# Patient Record
Sex: Male | Born: 1985 | Hispanic: No | Marital: Married | Smoking: Current some day smoker
Health system: Southern US, Community
[De-identification: ages and names within clinical notes are randomized; demographics above are authoritative.]

---

## 2013-03-27 ENCOUNTER — Encounter: Payer: Self-pay | Admitting: Internal Medicine

## 2013-03-27 ENCOUNTER — Ambulatory Visit: Payer: Medicaid Other | Attending: Internal Medicine | Admitting: Internal Medicine

## 2013-03-27 VITALS — BP 120/76 | HR 86 | Temp 99.0°F | Resp 16 | Ht 65.5 in | Wt 167.0 lb

## 2013-03-27 DIAGNOSIS — K649 Unspecified hemorrhoids: Secondary | ICD-10-CM

## 2013-03-27 LAB — LIPID PANEL
Cholesterol: 148 mg/dL (ref 0–200)
Total CHOL/HDL Ratio: 4.1 Ratio
Triglycerides: 189 mg/dL — ABNORMAL HIGH (ref <150)
VLDL: 38 mg/dL (ref 0–40)

## 2013-03-27 LAB — TSH: TSH: 1.199 u[IU]/mL (ref 0.350–4.500)

## 2013-03-27 LAB — CBC WITH DIFFERENTIAL/PLATELET
Basophils Absolute: 0 10*3/uL (ref 0.0–0.1)
Basophils Relative: 0 % (ref 0–1)
Eosinophils Absolute: 0.5 10*3/uL (ref 0.0–0.7)
Eosinophils Relative: 8 % — ABNORMAL HIGH (ref 0–5)
HCT: 42.7 % (ref 39.0–52.0)
Hemoglobin: 15 g/dL (ref 13.0–17.0)
Lymphs Abs: 1.8 10*3/uL (ref 0.7–4.0)
MCH: 30.2 pg (ref 26.0–34.0)
MCHC: 35.1 g/dL (ref 30.0–36.0)
MCV: 85.9 fL (ref 78.0–100.0)
Monocytes Absolute: 0.6 10*3/uL (ref 0.1–1.0)
Monocytes Relative: 9 % (ref 3–12)
Neutrophils Relative %: 57 % (ref 43–77)
RBC: 4.97 MIL/uL (ref 4.22–5.81)
RDW: 13.4 % (ref 11.5–15.5)

## 2013-03-27 LAB — COMPLETE METABOLIC PANEL WITH GFR
AST: 17 U/L (ref 0–37)
Albumin: 4.5 g/dL (ref 3.5–5.2)
Alkaline Phosphatase: 63 U/L (ref 39–117)
BUN: 7 mg/dL (ref 6–23)
Calcium: 9.3 mg/dL (ref 8.4–10.5)
Chloride: 102 mEq/L (ref 96–112)
Creat: 0.85 mg/dL (ref 0.50–1.35)
GFR, Est African American: >89 mL/min
GFR, Est Non African American: >89 mL/min
Glucose, Bld: 102 mg/dL — ABNORMAL HIGH (ref 70–99)

## 2013-03-27 MED ORDER — LORATADINE 10 MG PO TABS: 10.0000 mg | ORAL_TABLET | Freq: Every day | ORAL | Status: DC

## 2013-03-27 MED ORDER — HYDROCORTISONE ACETATE 25 MG RE SUPP: 25.0000 mg | Freq: Two times a day (BID) | RECTAL | Status: DC

## 2013-03-27 MED ORDER — POLYETHYLENE GLYCOL 3350 17 GM/SCOOP PO POWD: 17.0000 g | Freq: Two times a day (BID) | ORAL | Status: DC | PRN

## 2013-03-27 MED ORDER — HYDROCORTISONE 2.5 % RE CREA: 1.0000 "application " | TOPICAL_CREAM | Freq: Two times a day (BID) | RECTAL | Status: DC

## 2013-03-27 NOTE — Addendum Note (Signed)
Addended by: Allayne Stack R on: 03/27/2013 12:46 PM   Modules accepted: Orders, Medications

## 2013-03-27 NOTE — Progress Notes (Signed)
Pt is here to establish care. His case worker made this appointment for him today. Pt is requesting a physical.

## 2013-03-27 NOTE — Progress Notes (Signed)
Patient ID: Jared Woods, male   DOB: April 08, 1985, 27 y.o.   MRN: [id number]   CC:  HPI: 27 year old male from [place] to establish care. History is obtained using an interpreter. His chief complaint is history of being constipated with hemorrhoids, every 6 months he notices blood in the stool. No current active bleeding He also notices nosebleeds, related to sinus infection.  He is a nonsmoker nonalcoholic  Family history positive for hypertension in the mother     No Known Allergies History reviewed. No pertinent past medical history. No current outpatient prescriptions on file prior to visit.   No current facility-administered medications on file prior to visit.   History reviewed. No pertinent family history. History   Social History  . Marital Status: Married    Spouse Name: N/A    Number of Children: N/A  . Years of Education: N/A   Occupational History  . Not on file.   Social History Main Topics  . Smoking status: Current Some Day Smoker -- 0.10 packs/day    Types: Cigarettes  . Smokeless tobacco: Not on file  . Alcohol Use: 0.0 oz/week    .5 drink(s) per week  . Drug Use: No  . Sexual Activity: Yes   Other Topics Concern  . Not on file   Social History Narrative  . No narrative on file    Review of Systems  Constitutional: Negative for fever, chills, diaphoresis, activity change, appetite change and fatigue.  HENT: Negative for ear pain, nosebleeds, congestion, facial swelling, rhinorrhea, neck pain, neck stiffness and ear discharge.   Eyes: Negative for pain, discharge, redness, itching and visual disturbance.  Respiratory: Negative for cough, choking, chest tightness, shortness of breath, wheezing and stridor.   Cardiovascular: Negative for chest pain, palpitations and leg swelling.  Gastrointestinal: Negative for abdominal distention.  Genitourinary: Negative for dysuria, urgency, frequency, hematuria, flank pain, decreased urine volume, difficulty  urinating and dyspareunia.  Musculoskeletal: Negative for back pain, joint swelling, arthralgias and gait problem.  Neurological: Negative for dizziness, tremors, seizures, syncope, facial asymmetry, speech difficulty, weakness, light-headedness, numbness and headaches.  Hematological: Negative for adenopathy. Does not bruise/bleed easily.  Psychiatric/Behavioral: Negative for hallucinations, behavioral problems, confusion, dysphoric mood, decreased concentration and agitation.    Objective:   Filed Vitals:   03/27/13 1100  BP: 120/76  Pulse: 86  Temp: 99 F (37.2 C)  Resp: 16    Physical Exam  Constitutional: Appears well-developed and well-nourished. No distress.  HENT: Normocephalic. External right and left ear normal. Oropharynx is clear and moist.  Eyes: Conjunctivae and EOM are normal. PERRLA, no scleral icterus.  Neck: Normal ROM. Neck supple. No JVD. No tracheal deviation. No thyromegaly.  CVS: RRR, S1/S2 +, no murmurs, no gallops, no carotid bruit.  Pulmonary: Effort and breath sounds normal, no stridor, rhonchi, wheezes, rales.  Abdominal: Soft. BS +,  no distension, tenderness, rebound or guarding.  Musculoskeletal: Normal range of motion. No edema and no tenderness.  Lymphadenopathy: No lymphadenopathy noted, cervical, inguinal. Neuro: Alert. Normal reflexes, muscle tone coordination. No cranial nerve deficit. Skin: Skin is warm and dry. No rash noted. Not diaphoretic. No erythema. No pallor.  Psychiatric: Normal mood and affect. Behavior, judgment, thought content normal.   No results found for this basename: WBC, HGB, HCT, MCV, PLT   No results found for this basename: CREATININE, BUN, NA, K, CL, CO2    No results found for this basename: HGBA1C   Lipid Panel  No results found for this basename: chol,  trig, hdl, cholhdl, vldl, ldlcalc       Assessment and plan:   There are no active problems to display for this patient.      Hemorrhoids Prescribed  Anusol HC for hemorrhoids Prescribed MiraLAX for constipation If symptoms persist would need a GI referral   Sinusitis Started the patient on Claritin  Establish care Obtain baseline labs Flu vaccination  The patient was given clear instructions to go to ER or return to medical center if symptoms don't improve, worsen or new problems develop. The patient verbalized understanding. The patient was told to call to get any lab results if not heard anything in the next week.

## 2013-04-08 ENCOUNTER — Telehealth: Payer: Self-pay | Admitting: *Deleted

## 2013-04-08 NOTE — Telephone Encounter (Signed)
Message copied by Kacy Conely, [place] R on Mon Apr 08, 2013 10:58 AM ------      Message from: Susie CassetteABROL MD, Germain OsgoodNAYANA      Created: Mon Apr 01, 2013  3:07 PM       Please notify patient of the patient's labs are within normal limits with the exception of triglycerides. Recommend a low-fat low-cholesterol diet. ------

## 2013-04-13 ENCOUNTER — Emergency Department (HOSPITAL_COMMUNITY): Payer: Medicaid Other

## 2013-04-13 ENCOUNTER — Emergency Department (HOSPITAL_COMMUNITY)
Admission: EM | Admit: 2013-04-13 | Discharge: 2013-04-14 | Disposition: A | Discharge status: Home / Self Care | Payer: Medicaid Other | Attending: Emergency Medicine | Admitting: Emergency Medicine

## 2013-04-13 ENCOUNTER — Encounter (HOSPITAL_COMMUNITY): Payer: Self-pay | Admitting: Emergency Medicine

## 2013-04-13 DIAGNOSIS — R69 Illness, unspecified: Secondary | ICD-10-CM

## 2013-04-13 DIAGNOSIS — R51 Headache: Secondary | ICD-10-CM | POA: Insufficient documentation

## 2013-04-13 DIAGNOSIS — Z79899 Other long term (current) drug therapy: Secondary | ICD-10-CM | POA: Insufficient documentation

## 2013-04-13 DIAGNOSIS — R5383 Other fatigue: Secondary | ICD-10-CM

## 2013-04-13 DIAGNOSIS — J111 Influenza due to unidentified influenza virus with other respiratory manifestations: Secondary | ICD-10-CM | POA: Insufficient documentation

## 2013-04-13 DIAGNOSIS — IMO0001 Reserved for inherently not codable concepts without codable children: Secondary | ICD-10-CM | POA: Insufficient documentation

## 2013-04-13 DIAGNOSIS — R112 Nausea with vomiting, unspecified: Secondary | ICD-10-CM | POA: Insufficient documentation

## 2013-04-13 DIAGNOSIS — R5381 Other malaise: Secondary | ICD-10-CM | POA: Insufficient documentation

## 2013-04-13 DIAGNOSIS — F17200 Nicotine dependence, unspecified, uncomplicated: Secondary | ICD-10-CM | POA: Insufficient documentation

## 2013-04-13 LAB — CBC WITH DIFFERENTIAL/PLATELET
BASOS ABS: 0 10*3/uL (ref 0.0–0.1)
Basophils Relative: 0 % (ref 0–1)
EOS ABS: 0 10*3/uL (ref 0.0–0.7)
EOS PCT: 1 % (ref 0–5)
HCT: 38.5 % — ABNORMAL LOW (ref 39.0–52.0)
Hemoglobin: 13.7 g/dL (ref 13.0–17.0)
Lymphocytes Relative: 24 % (ref 12–46)
Lymphs Abs: 0.8 10*3/uL (ref 0.7–4.0)
MCH: 30.2 pg (ref 26.0–34.0)
MCHC: 35.6 g/dL (ref 30.0–36.0)
MCV: 85 fL (ref 78.0–100.0)
Monocytes Absolute: 0.3 10*3/uL (ref 0.1–1.0)
Monocytes Relative: 8 % (ref 3–12)
Neutro Abs: 2.2 10*3/uL (ref 1.7–7.7)
Neutrophils Relative %: 67 % (ref 43–77)
PLATELETS: 138 10*3/uL — AB (ref 150–400)
RBC: 4.53 MIL/uL (ref 4.22–5.81)
RDW: 12 % (ref 11.5–15.5)
WBC: 3.3 10*3/uL — AB (ref 4.0–10.5)

## 2013-04-13 LAB — BASIC METABOLIC PANEL
BUN: 5 mg/dL — ABNORMAL LOW (ref 6–23)
CALCIUM: 8.4 mg/dL (ref 8.4–10.5)
CO2: 26 mEq/L (ref 19–32)
Chloride: 101 mEq/L (ref 96–112)
Creatinine, Ser: 0.85 mg/dL (ref 0.50–1.35)
GFR calc Af Amer: >90 mL/min (ref >90)
Glucose, Bld: 171 mg/dL — ABNORMAL HIGH (ref 70–99)
Potassium: 4.3 mEq/L (ref 3.7–5.3)
SODIUM: 136 meq/L — AB (ref 137–147)

## 2013-04-13 LAB — CG4 I-STAT (LACTIC ACID): LACTIC ACID, VENOUS: 1.54 mmol/L (ref 0.5–2.2)

## 2013-04-13 MED ORDER — SODIUM CHLORIDE 0.9 % IV BOLUS (SEPSIS)
1000.0000 mL | Freq: Once | INTRAVENOUS | Status: AC
  Administered 2013-04-13: 1000 mL via INTRAVENOUS

## 2013-04-13 MED ORDER — KETOROLAC TROMETHAMINE 30 MG/ML IJ SOLN
30.0000 mg | Freq: Once | INTRAMUSCULAR | Status: AC
  Administered 2013-04-13: 30 mg via INTRAVENOUS
  Filled 2013-04-13: qty 1

## 2013-04-13 NOTE — ED Provider Notes (Signed)
CSN: [id number]     Arrival date & time 04/13/13  2202 History   First MD Initiated Contact with Patient 04/13/13 2212     Chief Complaint  Patient presents with  . Influenza   (Consider location/radiation/quality/duration/timing/severity/associated sxs/prior Treatment) HPI Comments: Patient from [place], has lived in [place] 3-4 months -- presents with c/o fever, cough, myalgias, HA, N/V x 3 days. Has taken 'fever medicine' he got at [facility]. He has SOB, CP with cough. EMS gave tylenol PTA. No PMH heart or lung problems. The onset of this condition was acute. The course is constant. Aggravating factors: none. Alleviating factors: none.    Patient is a 28 y.o. male presenting with flu symptoms. The history is provided by the patient and a friend. The history is limited by a language barrier. A language interpreter was used ([place], [place]).  Influenza Presenting symptoms: cough, fatigue, fever, headache, nausea, shortness of breath and vomiting   Presenting symptoms: no diarrhea, no myalgias, no rhinorrhea and no sore throat   Associated symptoms: chills and nasal congestion   Associated symptoms: no ear pain and no neck stiffness     History reviewed. No pertinent past medical history. History reviewed. No pertinent past surgical history. No family history on file. History  Substance Use Topics  . Smoking status: Current Some Day Smoker -- 0.10 packs/day    Types: Cigarettes  . Smokeless tobacco: Not on file  . Alcohol Use: 0.0 oz/week    .5 drink(s) per week    Review of Systems  Constitutional: Positive for fever, chills and fatigue.  HENT: Positive for congestion. Negative for ear pain, rhinorrhea, sinus pressure and sore throat.   Eyes: Negative for redness.  Respiratory: Positive for cough and shortness of breath. Negative for wheezing.   Cardiovascular: Positive for chest pain (with cough). Negative for leg swelling.  Gastrointestinal: Positive for nausea and vomiting.  Negative for abdominal pain and diarrhea.  Genitourinary: Negative for dysuria.  Musculoskeletal: Negative for myalgias and neck stiffness.  Skin: Negative for rash.  Neurological: Positive for headaches.  Hematological: Negative for adenopathy.    Allergies  Review of patient's allergies indicates no known allergies.  Home Medications   Current Outpatient Rx  Name  Route  Sig  Dispense  Refill  . hydrocortisone (ANUSOL-HC) 25 MG suppository   Rectal   Place 1 suppository (25 mg total) rectally 2 (two) times daily.   30 suppository   5   . loratadine (CLARITIN) 10 MG tablet   Oral   Take 1 tablet (10 mg total) by mouth daily.   30 tablet   11   . polyethylene glycol powder (GLYCOLAX/MIRALAX) powder   Oral   Take 17 g by mouth 2 (two) times daily as needed.   3350 g   10    BP 92/75  Pulse 99  Temp(Src) 100.4 F (38 C) (Oral)  SpO2 96% Physical Exam  Nursing note and vitals reviewed. Constitutional: He appears well-developed and well-nourished.  HENT:  Head: Normocephalic and atraumatic.  Right Ear: External ear normal.  Left Ear: External ear normal.  Nose: Nose normal.  Mouth/Throat: Oropharynx is clear and moist. No oropharyngeal exudate.  Eyes: Conjunctivae are normal. Pupils are equal, round, and reactive to light. Right eye exhibits no discharge. Left eye exhibits no discharge.  Neck: Normal range of motion. Neck supple.  Cardiovascular: Normal rate, regular rhythm and normal heart sounds.   Pulmonary/Chest: Breath sounds normal. Tachypnea noted. No respiratory distress. He has no wheezes.  He has no rales.  Abdominal: Soft. There is no tenderness. There is no rebound and no guarding.  Neurological: He is alert.  Skin: Skin is warm and dry.  Psychiatric: He has a normal mood and affect.    ED Course  Procedures (including critical care time) Labs Review Labs Reviewed  CBC WITH DIFFERENTIAL - Abnormal; Notable for the following:    WBC 3.3 (*)    HCT  38.5 (*)    Platelets 138 (*)    All other components within normal limits  BASIC METABOLIC PANEL - Abnormal; Notable for the following:    Sodium 136 (*)    Glucose, Bld 171 (*)    BUN 5 (*)    All other components within normal limits  CG4 I-STAT (LACTIC ACID)   Imaging Review Dg Chest 2 View  04/13/2013   CLINICAL DATA:  Shortness of breath, cough, fever.  EXAM: CHEST  2 VIEW  COMPARISON:  None.  FINDINGS: The heart size and mediastinal contours are within normal limits. Both lungs are clear. No pleural effusion or pneumothorax is noted. The visualized skeletal structures are unremarkable.  IMPRESSION: No active cardiopulmonary disease.   Electronically Signed   By: Roque LiasJames  Green M.D.   On: 04/13/2013 23:04    EKG Interpretation   None      10:31 PM Patient seen and examined. Work-up initiated. Medications ordered.   Vital signs reviewed and are as follows: Filed Vitals:   04/13/13 2205  BP: 92/75  Pulse: 99  Temp: 100.4 F (38 C)   12:58 AM Patient updated on results via interpreter phone. He states that he is feeling much better after toradol/fluids/zofran. He appears much better.   Patient discharged to home. Encouraged to rest and drink plenty of fluids.  Patient told to return to ED or see their primary doctor if their symptoms worsen, high fever not controlled with tylenol, persistent vomiting, they feel they are dehydrated, or if they have any other concerns.  Patient verbalized understanding and agreed with plan.    BP 92/56  Pulse 66  Temp(Src) 98 F (36.7 C) (Oral)  Resp 18  SpO2 97%    MDM   1. Influenza-like illness    Patient with symptoms consistent with influenza.  Vitals are stable, low-grade fever, improved.  No signs of dehydration, tolerating PO's.  Lungs are clear.  Supportive therapy indicated with return if symptoms worsen.  Patient counseled. BP noted but lactate nml, no tachycardia, patient appears well, non-toxic.      Renne CriglerJoshua Xiomara Sevillano,  PA-C 04/14/13 [phone]

## 2013-04-13 NOTE — ED Notes (Signed)
Pt c/o body aches, fever, cough, vomiting, headache, abd pain x 3 days. Pt does not speak [place]. Triage interview completed through visitor at bedside

## 2013-04-13 NOTE — ED Notes (Signed)
Pt BIB EMS. Pt has had n/v/d and flu symptoms for the past 2 days. Pt had fever 101.2 per EMS. EMS gave pt Tylenol 1000mg  PTA. Pt has productive cough. Pt with no acute distress.

## 2013-04-14 MED ORDER — BENZONATATE 100 MG PO CAPS: 100.0000 mg | ORAL_CAPSULE | Freq: Three times a day (TID) | ORAL | Status: AC

## 2013-04-14 MED ORDER — ONDANSETRON 4 MG PO TBDP: 4.0000 mg | ORAL_TABLET | Freq: Three times a day (TID) | ORAL | Status: AC | PRN

## 2013-04-14 MED ORDER — NAPROXEN 500 MG PO TABS: 500.0000 mg | ORAL_TABLET | Freq: Two times a day (BID) | ORAL | Status: AC

## 2013-04-14 NOTE — Discharge Instructions (Signed)
Please read and follow all provided instructions.  Your diagnoses today include:  1. Influenza-like illness     Tests performed today include:  Chest x-ray - does not show pneumonia  Blood counts and electrolytes - no concerning infection  Vital signs. See below for your results today.   Medications prescribed:   Naproxen - anti-inflammatory pain medication  Do not exceed 500mg  naproxen every 12 hours, take with food  You have been prescribed an anti-inflammatory medication or NSAID. Take with food. Take smallest effective dose for the shortest duration needed for your pain. Stop taking if you experience stomach pain or vomiting.    Zofran (ondansetron) - for nausea and vomiting   Tessalon Perles - cough suppressant medication  Take any prescribed medications only as directed.  Home care instructions:  Follow any educational materials contained in this packet. Please continue drinking plenty of fluids. Use over-the-counter cold and flu medications as needed as directed on packaging for symptom relief. You may also use ibuprofen or tylenol as directed on packaging for pain or fever.   BE VERY CAREFUL not to take multiple medicines containing Tylenol (also called acetaminophen). Doing so can lead to an overdose which can damage your liver and cause liver failure and possibly death.   Follow-up instructions: Please follow-up with your primary care provider in the next 3 days for further evaluation of your symptoms. If you do not have a primary care doctor -- see below for referral information.   Return instructions:   Please return to the Emergency Department if you experience worsening symptoms.  Please return if you have a high fever greater than 101 degrees not controlled with over-the-counter medications, persistent vomiting and cannot keep down fluids, or worsening trouble breathing.  Please return if you have any other emergent concerns.  Additional  Information:  Your vital signs today were: BP 92/56   Pulse 66   Temp(Src) 98 F (36.7 C) (Oral)   Resp 18   SpO2 97% If your blood pressure (BP) was elevated above 135/85 this visit, please have this repeated by your doctor within one month.    Emergency Department Resource Guide 1) Find a Doctor and Pay Out of Pocket Although you won't have to find out who is covered by your insurance plan, it is a good idea to ask around and get recommendations. You will then need to call the office and see if the doctor you have chosen will accept you as a new patient and what types of options they offer for patients who are self-pay. Some doctors offer discounts or will set up payment plans for their patients who do not have insurance, but you will need to ask so you aren't surprised when you get to your appointment.  2) Contact Your Local Health Department Not all health departments have doctors that can see patients for sick visits, but many do, so it is worth a call to see if yours does. If you don't know where your local health department is, you can check in your phone book. The CDC also has a tool to help you locate your state's health department, and many state websites also have listings of all of their local health departments.  3) Find a Walk-in Clinic If your illness is not likely to be very severe or complicated, you may want to try a walk in clinic. These are popping up all over the country in pharmacies, drugstores, and shopping centers. They're usually staffed by nurse practitioners or physician  assistants that have been trained to treat common illnesses and complaints. They're usually fairly quick and inexpensive. However, if you have serious medical issues or chronic medical problems, these are probably not your best option.  No Primary Care Doctor: - Call Health Connect at  [phone] - they can help you locate a primary care doctor that  accepts your insurance, provides certain services,  etc. - Physician Referral Service- [phone]  Chronic Pain Problems: Organization         Address  Phone   Notes  [facility]  [phone] Patients need to be referred by their primary care doctor.   Medication Assistance: Organization         Address  Phone   Notes  [facility] Medication [facility] [address] [place] [place], [place] [address] [phone] --Must be a resident of [facility] -- Must have NO insurance coverage whatsoever (no Medicaid/ Medicare, etc.) -- The pt. MUST have a primary care doctor that directs their care regularly and follows them in the community   [facility]  [phone]   [employer]  [phone]    Agencies that provide inexpensive medical care: Organization         Address  Phone   Notes  [facility]  [phone]   [facility]    [phone]   [facility] [address] [place], [place] [address] [phone]   [facility] [address] [place], [place] [phone]   Planned Parenthood    [phone]   [facility]    [phone]   [facility]  [address], [place] Phone:  [phone], Fax:  [phone] Hours of Operation:  9 am - 6 pm, M-F.  Also accepts Medicaid/Medicare and self-pay.  [facility]  [address], [place] Phone: [phone], Fax: [phone]. Hours of Operation:  8:30 am - 5:30 pm, M-F.  Also accepts Medicaid and self-pay.  [facility] [place] [address] [place] Phone: [phone]   [facility] [address] Natasha Bence [place], [place] [phone], Ext. [phone] Mondays & Thursdays: 7-9 AM.  First 15 patients are seen on a first come, first serve basis.    Medicaid-accepting [facility] Providers:  Organization         Address  Phone   Notes  [facility] [address], [place] [phone] Also accepts self-pay patients.  [facility] [address] Laurell Josephs [place], [place]  [phone]   [facility] [address], [place] [phone]   [facility] [address], [place] [phone]   Renaye Rakers [address], [place]   [phone] Only accepts [place] patients after they have their name applied to their card.   Self-Pay (no insurance) in [facility]:  Organization         Address  Phone   Notes  Sickle Cell Patients, [facility] [address] [place], [place] [phone]   [facility] [address] [place], [place] [phone]   [facility] [place]  [address], [place] [phone]   [facility]/Dr. Julio Sicks  [address] [place]  [address], [place] or [address], [place] [phone] Phone number for both [place] and [place] locations is the same.  [facility] [address], [place] [phone]   [facility] [address] [place] or [address] [phone]   [facility] [address], [place] [phone], phone; [phone], fax Sees patients 1st and 3rd Saturday of every month.  Must not qualify for public or private insurance (i.e. Medicaid, Medicare, [place] Health Choice, Veterans' Benefits)  Household income should be no more than 200% of the poverty level The clinic cannot treat you if you are pregnant or think you are pregnant  Sexually transmitted diseases are not treated at the clinic.    Dental Care: Organization         Address  Phone  Notes  [facility] Department of [facility] [address] [place], [place] [phone] Accepts children up to  age 19 who are enrolled in [place] or [place] Health Choice; pregnant women with a Medicaid card; and children who have applied for Medicaid or [place] Health Choice, but were declined, whose parents can pay a reduced fee at time of service.  [facility] Department of [facility]  [address], [place] [phone] Accepts children up to age 50 who are enrolled in [place] or [place] Health Choice; pregnant women with a Medicaid card; and children who have applied for Medicaid or [place] Health Choice, but were declined, whose parents can pay a reduced fee at time of service.  [facility]  [address] [place], [place] [phone] Patients are seen by appointment only. Walk-ins are not accepted. [facility] will see patients 40 years of age and older. Monday - Tuesday (8am-5pm) Most Wednesdays (8:30-5pm) $30 per visit, cash only  [facility] Adult Dental Access PRO[facility]  [address], [facility] [phone] Patients are seen by appointment only. Walk-ins are not accepted. [facility] will see patients 66 years of age and older. One Wednesday Evening (Monthly: Volunteer Based).  $30 per visit, cash only  [employer]  [phone] for adults; Children under age 52, call Graduate Pediatric Dentistry at [phone]. Children aged 65-14, please call [phone] to request a pediatric application.  Dental services are provided in all areas of dental care including fillings, crowns and bridges, complete and partial dentures, implants, gum treatment, root canals, and extractions. Preventive care is also provided. Treatment is provided to both adults and children. Patients are selected via a lottery and there is often a waiting list.   [facility] [address] [place] [address], [place]  [phone] [email or web address]   [facility] [address] [place], [place] [phone], Ext. [phone] Second and Fourth Thursday of  each month, opens at 6:30 AM; Clinic ends at 9 AM.  Patients are seen on a first-come first-served basis, and a limited number are seen during each clinic.   [facility]  [address] Ether Griffins [place], [place] [phone]   Eligibility Requirements You must have lived in [place], [place], or [place] counties for at least the last three months.   You cannot be eligible for state or federal sponsored [place], including [employer], [place], or [employer].   You generally cannot be eligible for healthcare insurance through your employer.    How to  apply: Eligibility screenings are held every Tuesday and Wednesday afternoon from 1:00 pm until 4:00 pm. You do not need an appointment for the interview!  [facility] [address], [place], [place] [phone]   [facility]  [phone]   [facility]  [phone]   [facility]  [phone]    Behavioral Health Resources in the Community: Intensive Outpatient Programs Organization         Address  Phone  Notes  [facility] [address], [place], [place] [phone]   [facility] [address], [place], [place] [phone]   [facility] [address], [place], [place]  [phone]   [facility] [address],  [place], [place] [phone] or [phone]   Substance Abuse Resources Organization         Address  Phone  Notes  [facility]  [phone]   [facility]  [phone]   [facility]  [phone]   [facility]  [phone]   Residential & Outpatient Substance Abuse Program  [phone]   Psychological Services Organization         Address  Phone  Notes  [facility]  [phone]- [phone]   [facility]  [phone]- [phone]   [facility] [address],  [place] [phone] or [phone]    Mobile Crisis Teams Organization         Address  Phone  Notes  [facility], Mobile Crisis Care Unit  [phone]   [facility]  [address] [place], [place] [phone]   Doristine LocksSharon DeEsch [address] [place] [phone]    Self-Help/Support Groups Organization         Address  Phone             Notes  [facility] [place] - variety of support groups  [phone]- [phone] Call for more information  Narcotics Anonymous (NA), [facility] [address], [place]  2 meetings at this location   Statisticianesidential Treatment Programs Organization         Address  Phone  Notes  [facility] [address],    [place]  [phone]   [facility]  [address], [place] [phone], [place], [place] [phone]   [facility] [address], [place] [phone] Admissions: 8am-3pm M-F  [facility] [address]    [place], [place] [phone]   [facility] [address], [place], [place] [phone]   [facility] [address]  [place], [place] [phone]   [facility] - Intensive Outpatient [address], [place], [place] [phone]   [facility] ([facility] [address]  [place], [place] [phone] or [phone]   [facility] ([facility]) [address] [place], [place] [phone] Accepts Medicaid  [facility] [address]  [place] [phone] Substance Abuse/Addiction Treatment   [facility] Resources Organization         Address  Phone  Notes  [facility]  [phone]   Angie FavaJulie Brannon, PhD [address], [address] [place], [place]   [phone] or [phone]   [facility]   [address] [place], [place] [phone]    [facility] [address], [place],  [place] [phone] Insurance/Medicaid/sponsorship through [facility] [address]                                    [place], [place] [phone] Therapy/tele-psych/case  [facility] [address]   [place], [place] [phone]    Dr. Lolly Mustache  [phone]   [facility]  [employer] [facility] Dept. 1) [address], [place] 2) [address] [place] 3)  [address], [place] [phone]  [phone]   [facility] Child Abuse Hotline 417-035-4242 or [phone] (After Hours)

## 2013-04-17 NOTE — ED Provider Notes (Signed)
Medical screening examination/treatment/procedure(s) were performed by non-physician practitioner and as supervising physician I was immediately available for consultation/collaboration.  Toy BakerAnthony T Yamila Cragin, MD 04/17/13 [phone]

## 2013-04-23 ENCOUNTER — Other Ambulatory Visit: Payer: Self-pay | Admitting: Infectious Disease

## 2013-04-23 ENCOUNTER — Ambulatory Visit
Admission: RE | Admit: 2013-04-23 | Discharge: 2013-04-23 | Disposition: A | Discharge status: Home / Self Care | Payer: No Typology Code available for payment source | Source: Ambulatory Visit | Attending: Infectious Disease | Admitting: Infectious Disease

## 2013-04-23 DIAGNOSIS — J988 Other specified respiratory disorders: Secondary | ICD-10-CM

## 2013-09-25 ENCOUNTER — Ambulatory Visit: Payer: Medicaid Other | Admitting: Internal Medicine

## 2014-03-13 IMAGING — CR DG CHEST 2V
2 series · 2 of 2 positions shown · non-contrast
Comparison: None.

CLINICAL DATA: Shortness of breath, cough, fever.

EXAM:
CHEST  2 VIEW

[w chest pa]
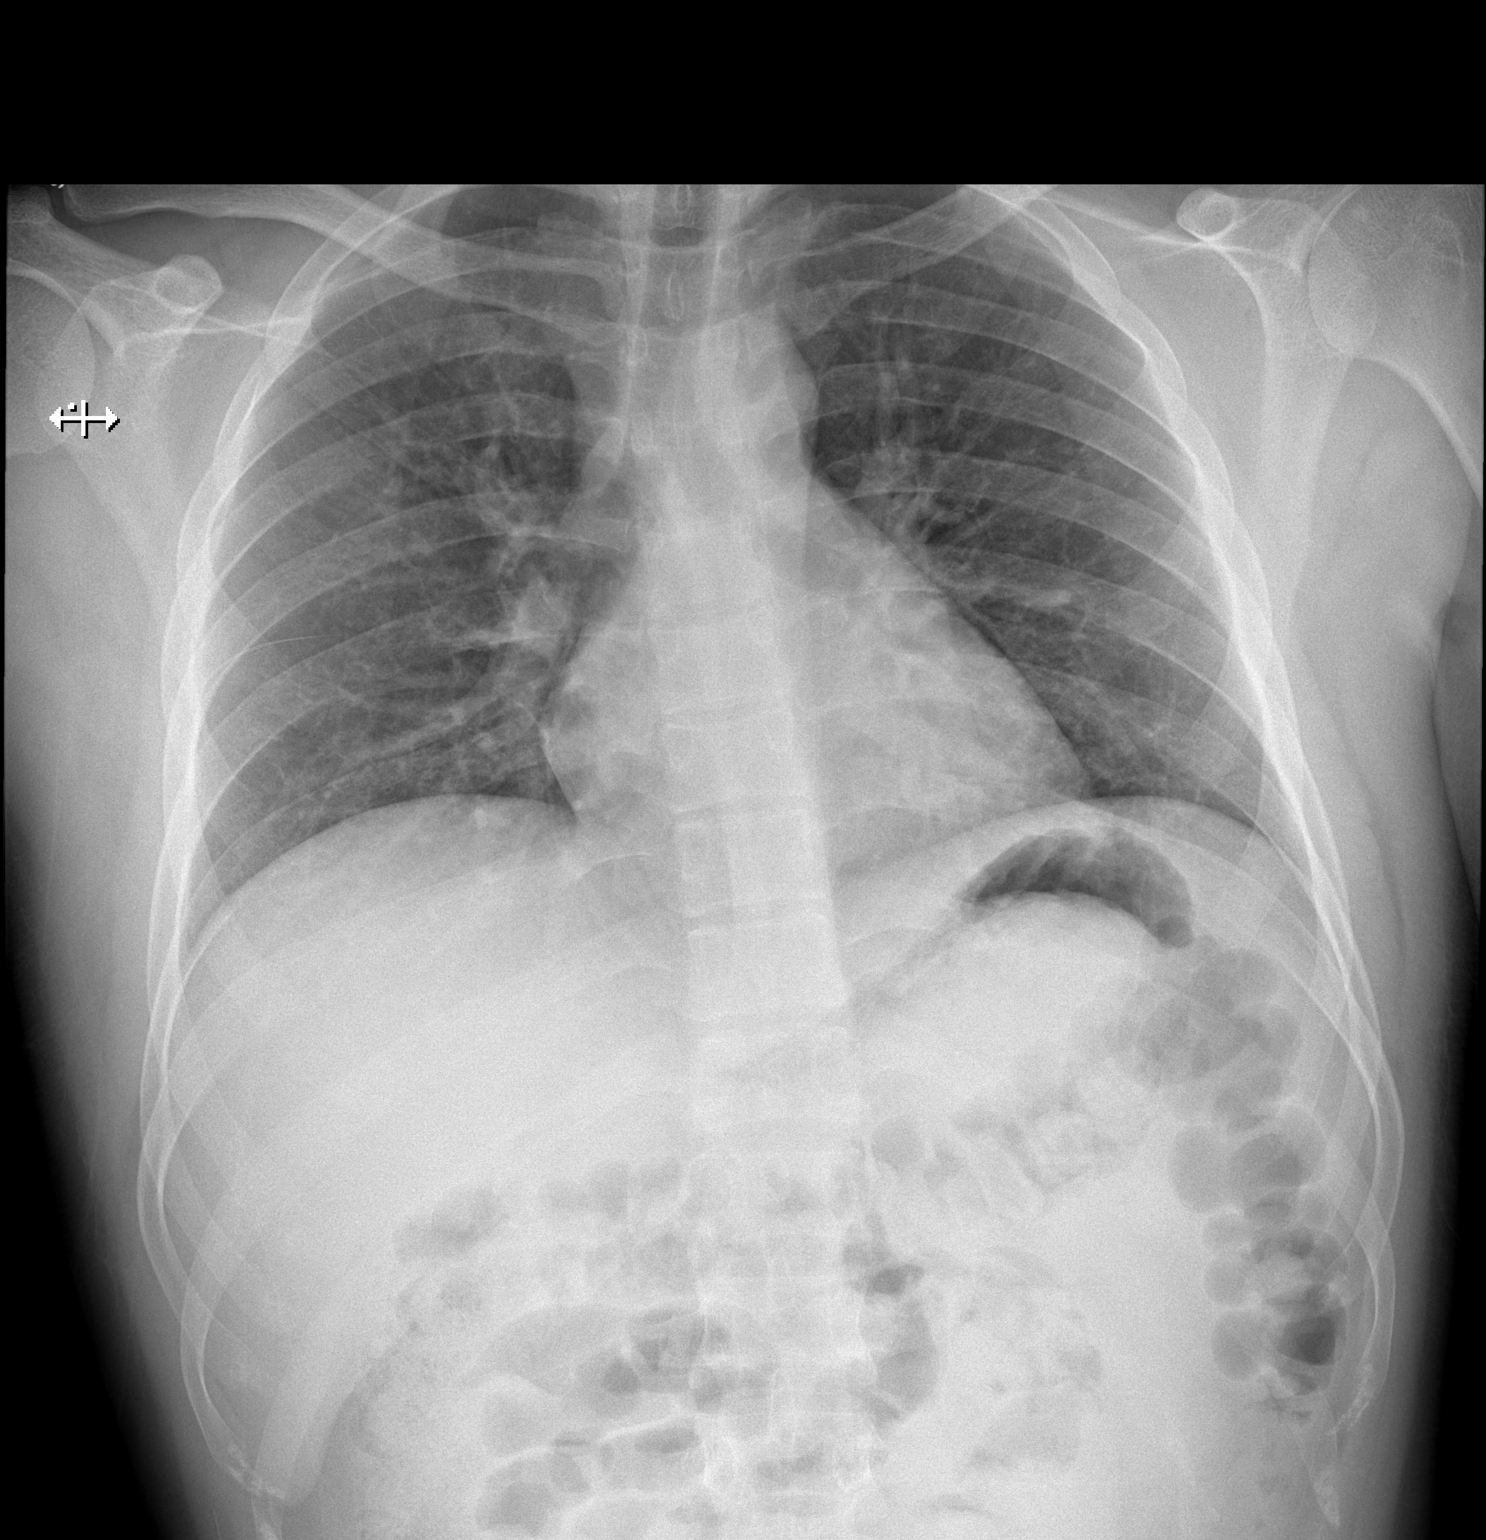

[w chest lat]
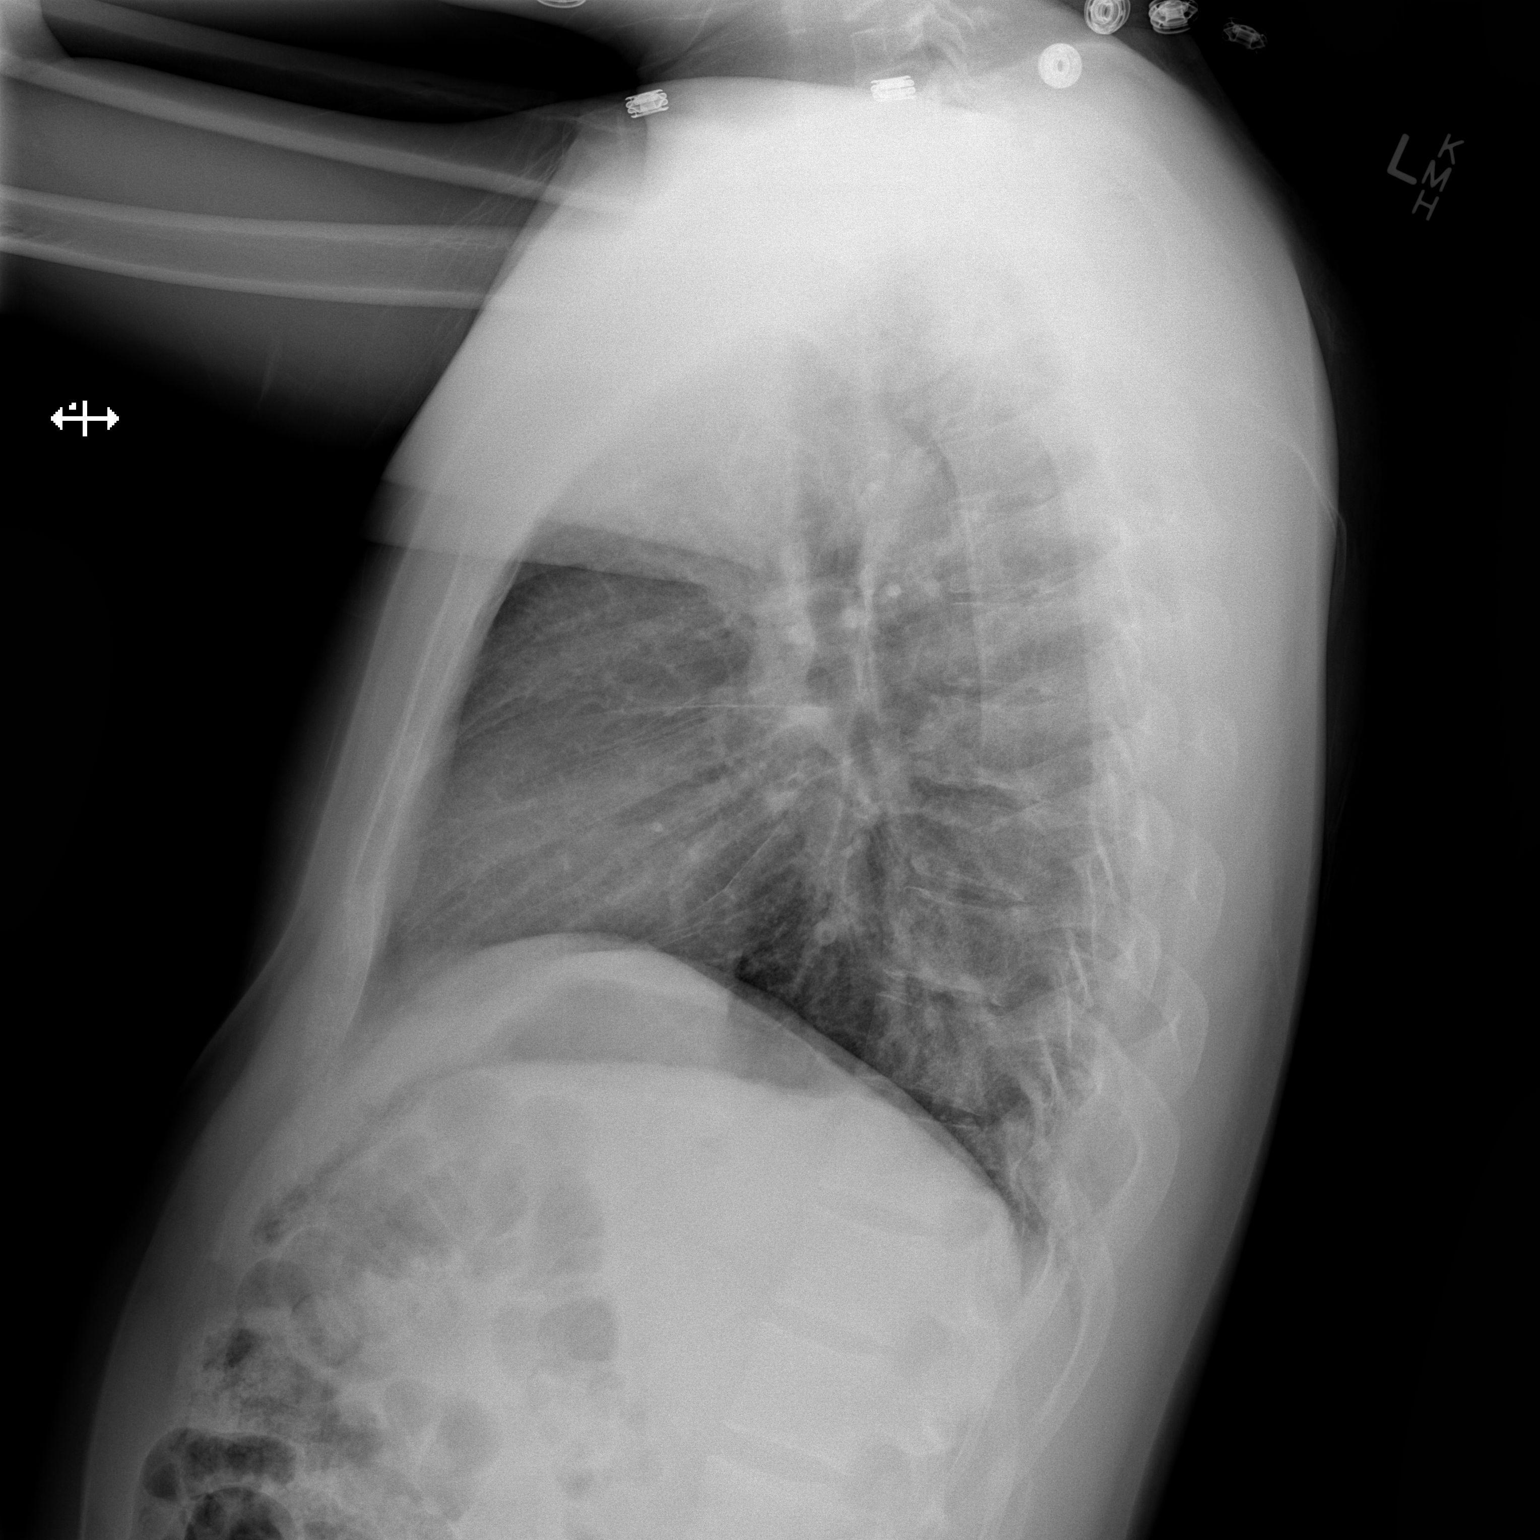

[2 of 2 positions shown; findings below may reference images not displayed]

FINDINGS: The heart size and mediastinal contours are within normal limits.
Both lungs are clear. No pleural effusion or pneumothorax is noted.
The visualized skeletal structures are unremarkable.
IMPRESSION: No active cardiopulmonary disease.

## 2018-11-07 ENCOUNTER — Other Ambulatory Visit: Payer: Self-pay

## 2018-11-07 DIAGNOSIS — Z20822 Contact with and (suspected) exposure to covid-19: Secondary | ICD-10-CM

## 2018-11-09 LAB — NOVEL CORONAVIRUS, NAA: SARS-CoV-2, NAA: NOT DETECTED
